# Patient Record
Sex: Male | Born: 1975 | Race: Black or African American | Hispanic: No | Marital: Single | Smoking: Former smoker
Health system: Southern US, Community
[De-identification: ages and names within clinical notes are randomized; demographics above are authoritative.]

---

## 1997-09-09 ENCOUNTER — Encounter: Admission: RE | Admit: 1997-09-09 | Discharge: 1997-09-09 | Payer: Self-pay | Admitting: *Deleted

## 2000-01-13 ENCOUNTER — Emergency Department (HOSPITAL_COMMUNITY): Admission: EM | Admit: 2000-01-13 | Discharge: 2000-01-13 | Payer: Self-pay | Admitting: Emergency Medicine

## 2000-01-20 ENCOUNTER — Emergency Department (HOSPITAL_COMMUNITY): Admission: EM | Admit: 2000-01-20 | Discharge: 2000-01-20 | Payer: Self-pay

## 2004-11-28 ENCOUNTER — Encounter: Admission: RE | Admit: 2004-11-28 | Discharge: 2004-11-28 | Payer: Self-pay | Admitting: Occupational Medicine

## 2011-03-06 ENCOUNTER — Ambulatory Visit (INDEPENDENT_AMBULATORY_CARE_PROVIDER_SITE_OTHER): Payer: PRIVATE HEALTH INSURANCE | Admitting: Family Medicine

## 2011-03-06 ENCOUNTER — Ambulatory Visit: Payer: PRIVATE HEALTH INSURANCE

## 2011-03-06 VITALS — BP 122/78 | HR 84 | Temp 98.4°F | Resp 18 | Ht 69.0 in | Wt 197.6 lb

## 2011-03-06 DIAGNOSIS — M25539 Pain in unspecified wrist: Secondary | ICD-10-CM

## 2011-03-06 DIAGNOSIS — R369 Urethral discharge, unspecified: Secondary | ICD-10-CM

## 2011-03-06 LAB — POCT URINALYSIS DIPSTICK
Bilirubin, UA: NEGATIVE
Glucose, UA: NEGATIVE
Ketones, UA: NEGATIVE
Leukocytes, UA: NEGATIVE
Nitrite, UA: NEGATIVE
Protein, UA: NEGATIVE
Urobilinogen, UA: 1
pH, UA: 7

## 2011-03-06 LAB — POCT UA - MICROSCOPIC ONLY
Bacteria, U Microscopic: NEGATIVE
Yeast, UA: NEGATIVE

## 2011-03-06 MED ORDER — METRONIDAZOLE 500 MG PO TABS: ORAL_TABLET | ORAL | Status: DC

## 2011-03-06 NOTE — Progress Notes (Signed)
  Subjective:    Patient ID: Cameron Stephenson, male    DOB: 12/21/1975, 36 y.o.   MRN: [id number]  HPI 36 yo male with two complaints 1) penile discharge - similar 1 year ago.  Tests negative.  Has continued to have off and on for occasion.  More common if hold urine for awhile.  Thick, milky consistency.  Ex did cheat onhim.  Does not know of specific disease exposure.  Unprotected sex with ex 5-6 months ago, but none since.    2) right hand pain- at wrist, dorsal surface.  Knot there.  Pain occasionally.  Sometimes clicks.  When it hurts can radiate up into forearm.  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    Review of Systems Negative except as per HPI     Objective:   Physical Exam  Constitutional: He appears well-developed and well-nourished.  Genitourinary: Testes normal and penis normal.  Musculoskeletal:       Right wrist: He exhibits normal range of motion, no tenderness, no bony tenderness, no swelling, no crepitus and no deformity.   Possible very small early ganglion cyst palpable on dorsal surface of right wrist.   [facility] Primary radiology reading by Dr. Georgiana Shore: Negative.  No fracture or other abnormality   Results for orders placed in visit on 03/06/11  POCT URINALYSIS DIPSTICK      Component Value Range   Color, UA yelloe     Clarity, UA clear     Glucose, UA neg     Bilirubin, UA neg     Ketones, UA neg     Spec Grav, UA 1.015     Blood, UA mod     pH, UA 7.0     Protein, UA neg     Urobilinogen, UA 1.0     Nitrite, UA neg     Leukocytes, UA Negative    POCT UA - MICROSCOPIC ONLY      Component Value Range   WBC, Ur, HPF, POC 0-2     RBC, urine, microscopic 2-4     Bacteria, U Microscopic neg     Mucus, UA neg     Epithelial cells, urine per micros neg     Crystals, Ur, HPF, POC neg     Casts, Ur, LPF, POC neg     Yeast, UA neg         Assessment & Plan:  Penile discharge - normal exam.  Intermittent symptoms.  NO exact exposure  known.  uriprobe pending.  Treat with flagyl 500, 4 po x 1 dose to cover for trich as can be hard to diagnose.   Wrist pain - benign exam.  Sporadic symptoms.  Ice, aleve prn.  RTC if worsens.

## 2011-03-08 ENCOUNTER — Telehealth: Payer: Self-pay

## 2011-03-08 DIAGNOSIS — R369 Urethral discharge, unspecified: Secondary | ICD-10-CM

## 2011-03-08 LAB — GC/CHLAMYDIA PROBE AMP, URINE

## 2011-03-08 NOTE — Telephone Encounter (Signed)
Dr. Georgiana Shore, [facility] called and said that they were unable to run the Uriprobe due to some lab error.

## 2011-03-09 NOTE — Telephone Encounter (Signed)
Left message on machine to call back  

## 2011-03-09 NOTE — Telephone Encounter (Signed)
Pt called back. Explained to pt another sample is needed and needs to RTC for lab only. Pt agreed.

## 2011-03-09 NOTE — Telephone Encounter (Signed)
Please contact patient and act him to return to give new sample. THanks.

## 2011-03-09 NOTE — Telephone Encounter (Signed)
Future order placed for uriprobe.

## 2011-03-09 NOTE — Telephone Encounter (Signed)
Addended by: Lamar Laundry on: 03/09/2011 05:47 PM   Modules accepted: Orders

## 2011-03-18 ENCOUNTER — Other Ambulatory Visit (INDEPENDENT_AMBULATORY_CARE_PROVIDER_SITE_OTHER): Payer: PRIVATE HEALTH INSURANCE

## 2011-03-18 DIAGNOSIS — Z7251 High risk heterosexual behavior: Secondary | ICD-10-CM

## 2011-03-18 DIAGNOSIS — R369 Urethral discharge, unspecified: Secondary | ICD-10-CM

## 2011-03-20 ENCOUNTER — Telehealth: Payer: Self-pay

## 2011-03-20 LAB — GC/CHLAMYDIA PROBE AMP, URINE
Chlamydia, Swab/Urine, PCR: NEGATIVE
GC Probe Amp, Urine: NEGATIVE

## 2011-03-20 NOTE — Telephone Encounter (Signed)
Pt returning clinical nurse phone call please contact

## 2011-03-21 NOTE — Telephone Encounter (Signed)
Please see lab note. Completed.

## 2013-01-19 IMAGING — CR DG WRIST COMPLETE 3+V*R*
2 series · 2 of 2 positions shown · non-contrast
Comparison: None.

CLINICAL DATA: Intermittent right wrist pain.

RIGHT WRIST - COMPLETE 3+ VIEW

[PA]
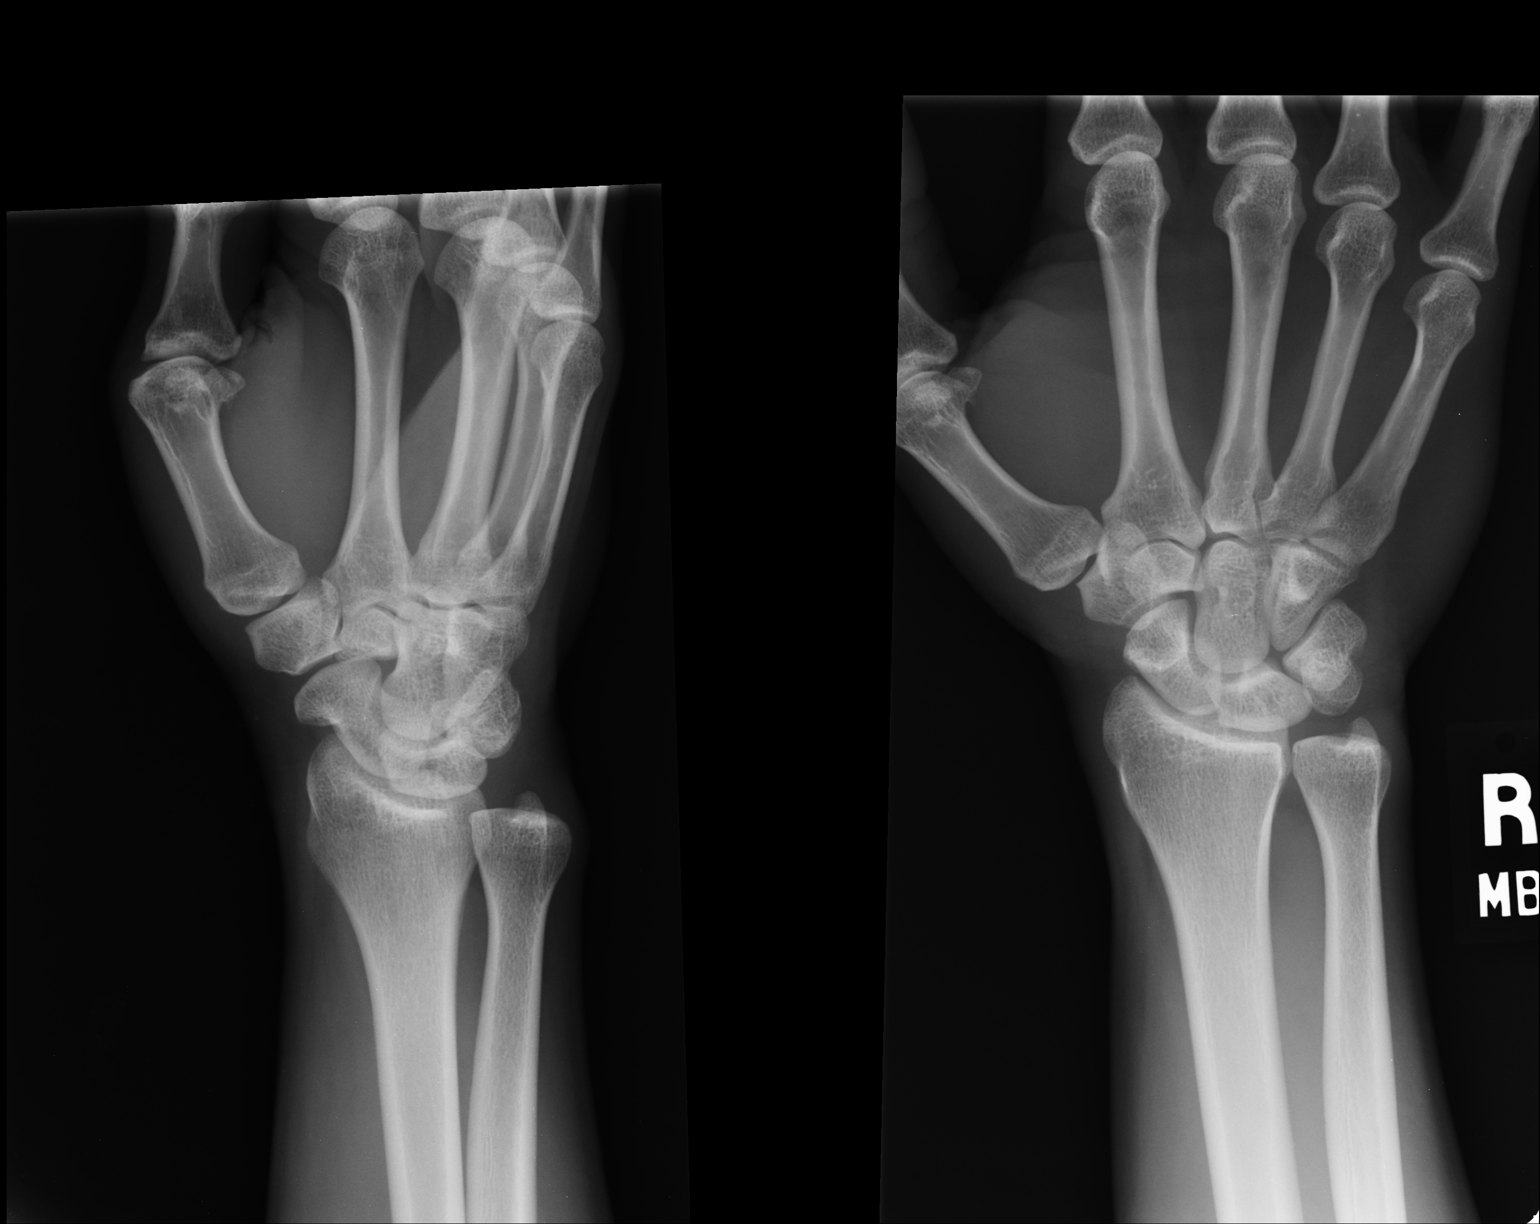

[lateral]
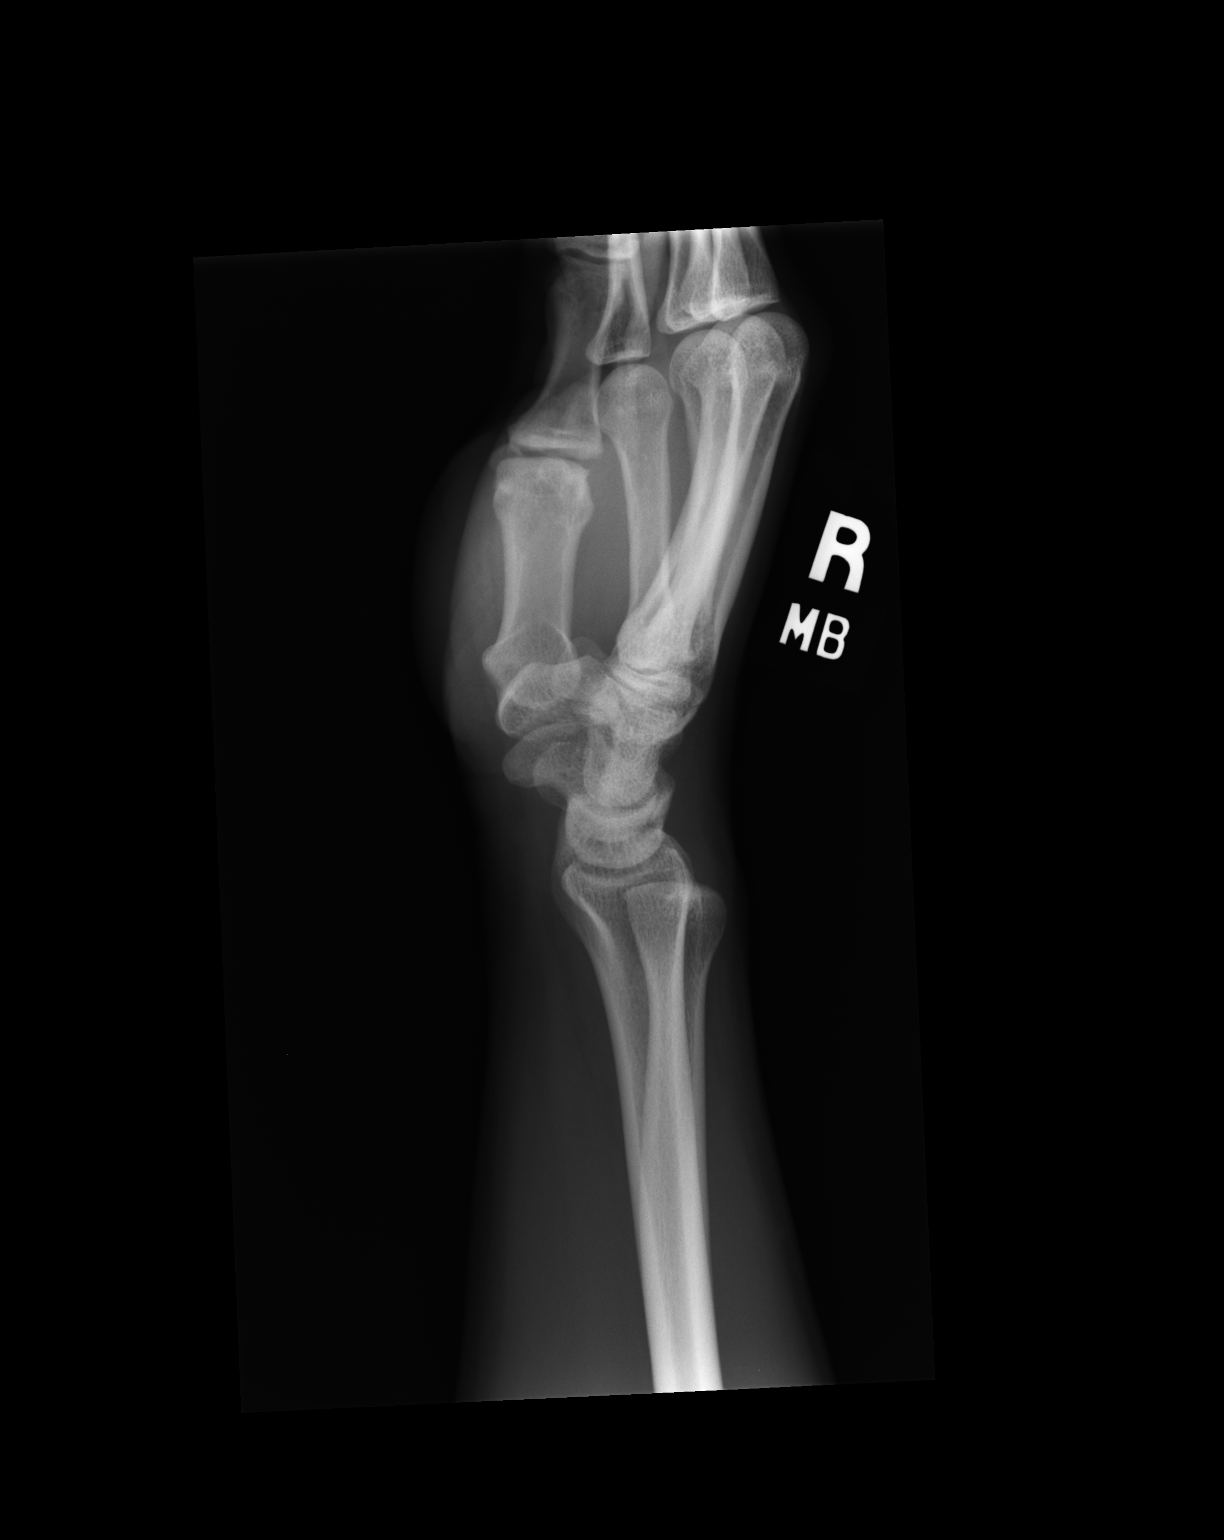

[2 of 2 positions shown; findings below may reference images not displayed]

FINDINGS: The right wrist is located.  No acute bone or soft tissue
abnormality is present.
IMPRESSION: Negative right wrist.

## 2013-02-28 ENCOUNTER — Ambulatory Visit (INDEPENDENT_AMBULATORY_CARE_PROVIDER_SITE_OTHER): Payer: PRIVATE HEALTH INSURANCE | Admitting: Internal Medicine

## 2013-02-28 VITALS — BP 148/80 | HR 73 | Temp 98.0°F | Resp 16 | Ht 70.0 in | Wt 201.0 lb

## 2013-02-28 DIAGNOSIS — M25531 Pain in right wrist: Secondary | ICD-10-CM

## 2013-02-28 DIAGNOSIS — H00019 Hordeolum externum unspecified eye, unspecified eyelid: Secondary | ICD-10-CM

## 2013-02-28 DIAGNOSIS — M25539 Pain in unspecified wrist: Secondary | ICD-10-CM

## 2013-02-28 NOTE — Progress Notes (Signed)
Subjective:    Patient ID: Cameron Stephenson, male    DOB: 25-Oct-1975, 38 y.o.   MRN: [id number] This chart was scribed for Ellamae Siaobert Kaheem Halleck, MD by Valera CastleSteven Perry, ED Scribe. This patient was seen in room 05 and the patient's care was started at 6:32 PM.  Chief Complaint  Patient presents with  . Follow-up    right wrist pain  . Stye    left eye    HPI Cameron Stephenson is a 38 y.o. male He reports a bump over his left eye, onset many months ago. He denies the area being painful, draining. He reports the area will intermittently be itchy. He denies fever, and any other associated symptoms.    He also reports constant right wrist pain. He states he was seen here before for his wrist pain, having imaging done, with negative results. He denies recent trauma. He reports when he was seen before for his wrist pain, the pain had been radiating up his right arm. He denies his current pain radiating up his arm. He denies any other bony pain. 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   PCP - No primary provider on file.  There are no active problems to display for this patient.  Prior to Admission medications   Medication Sig Start Date End Date Taking? Authorizing Provider  metroNIDAZOLE (FLAGYL) 500 MG tablet 4 tabs po for one dose 03/06/11   Lamar LaundryLaura C Mayans, MD   Review of Systems  Constitutional: Negative for fever.  Eyes: Positive for itching (itchy bump/stye over left eye, non painful).  Musculoskeletal: Positive for arthralgias (right wrist).      Objective:   Physical Exam  Nursing note and vitals reviewed. Constitutional: He is oriented to person, place, and time. He appears well-developed and well-nourished. No distress.  HENT:  Head: Normocephalic and atraumatic.  Eyes: EOM are normal.  He has a 0.5 cm cyst that is nontender in the left upper lid a when necessary aspect/there is no internal or external pointing/he has no redness there are  tenderness The eye itself is clear  Neck: Neck supple.  Cardiovascular: Normal rate.   Pulmonary/Chest: Effort normal. No respiratory distress.  Musculoskeletal: Normal range of motion. He exhibits tenderness (over right wrist).  The right wrist has tenderness over the snuffbox and into the dorsum of the wrist that is made worse by range of motion. The grip is intact. No sensory losses or vascular compromise. Negative Finkelstein/ Negative Tinel's.  Lymphadenopathy:    He has no cervical adenopathy.  Neurological: He is alert and oriented to person, place, and time.  Skin: Skin is warm and dry.  Psychiatric: He has a normal mood and affect. His behavior is normal.    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 Rahshawn Remo P. Merla Richesoolittle, M.D.

## 2014-07-16 ENCOUNTER — Other Ambulatory Visit: Payer: Self-pay | Admitting: Orthopedic Surgery

## 2014-07-29 ENCOUNTER — Encounter (HOSPITAL_BASED_OUTPATIENT_CLINIC_OR_DEPARTMENT_OTHER): Payer: Self-pay | Admitting: *Deleted

## 2014-07-31 NOTE — H&P (Signed)
Cameron Stephenson is an 39 y.o. male.   CC / Reason for Visit: Right wrist pain HPI: This patient returns for reevaluation.  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  He states that he continues to have problems with his ECU or dislocating which causes pain into his forearm.  Presenting history follows: This patient is a 39 year old male who presents for evaluation of his right wrist.  He reports that he has had pain in the right wrist chronically, without any specific preceding trauma.  He localizes the pain to the snuffbox and the dorsal radial aspect of the wrist.  He reports that he also has some popping in the wrist that occurs with circumduction, sometimes it is painful.  He reports that sometime in the past he had a dorsal enlargement on the wrist that has resolved.  This may have been a ganglion cyst  History reviewed. No pertinent past medical history.  History reviewed. No pertinent past surgical history.  History reviewed. No pertinent family history. Social History:  reports that he quit smoking about 13 years ago. He does not have any smokeless tobacco history on file. He reports that he drinks alcohol. His drug history is not on file.  Allergies: No Known Allergies  No prescriptions prior to admission    No results found for this or any previous visit (from the past 48 hour(s)). No results found.  Review of Systems  All other systems reviewed and are negative.   Height 5\' 11"  (1.803 m), weight 95.255 kg (210 lb). Physical Exam  Constitutional:  WD, WN, NAD HEENT:  NCAT, EOMI Neuro/Psych:  Alert & oriented to person, place, and time; appropriate mood & affect Lymphatic: No generalized UE edema or lymphadenopathy Extremities / MSK:  Both UE are normal with respect to appearance, ranges of motion, joint stability, muscle strength/tone,  sensation, & perfusion except as otherwise noted:  Right wrist grossly normal in appearance.  Distinct ECU dislocation can still be elicited today.  Labs / X-rays:  None today.  Assessment: Right wrist ECU instability  Plan:  Results of the evaluation were discussed with him again.  It was further discussed that he would be kept out of heavy work for 2-1/2 months and that he would have to wear a splint that kept him in a supinated position.  He indicated he would like to proceed, he will call Olegario MessierKathy my surgery coordinator to make further arrangements. The details of the operative procedure were discussed with the patient.  Questions were invited and answered.  In addition to the goal of the procedure, the risks of the procedure to include but not limited to bleeding; infection; damage to the nerves or blood vessels that could result in bleeding, numbness, weakness, chronic pain, and the need for additional procedures; stiffness; the need for revision surgery; and anesthetic risks were reviewed.  No specific outcome was guaranteed or implied.  Informed consent was obtained.    Work status: All interested parties should please consider this patient to be out of work entirely if no work is available that complies with the restrictions detailed below.  If these restrictions result in the patient being out of work entirely, the employer is expected to provide documentation of such to all interested third parties such as disability insurance companies:    As tolerated for now, without restrictions.  Cameron Stephenson A. 07/31/2014, 3:09 PM

## 2014-08-03 ENCOUNTER — Encounter (HOSPITAL_BASED_OUTPATIENT_CLINIC_OR_DEPARTMENT_OTHER)
Admission: RE | Disposition: A | Discharge status: Home / Self Care | Payer: Self-pay | Source: Ambulatory Visit | Attending: Orthopedic Surgery

## 2014-08-03 ENCOUNTER — Ambulatory Visit (HOSPITAL_BASED_OUTPATIENT_CLINIC_OR_DEPARTMENT_OTHER)
Admission: RE | Admit: 2014-08-03 | Discharge: 2014-08-03 | Disposition: A | Discharge status: Home / Self Care | Payer: Managed Care, Other (non HMO) | Source: Ambulatory Visit | Attending: Orthopedic Surgery | Admitting: Orthopedic Surgery

## 2014-08-03 ENCOUNTER — Ambulatory Visit (HOSPITAL_BASED_OUTPATIENT_CLINIC_OR_DEPARTMENT_OTHER): Payer: Managed Care, Other (non HMO) | Admitting: Certified Registered"

## 2014-08-03 ENCOUNTER — Encounter (HOSPITAL_BASED_OUTPATIENT_CLINIC_OR_DEPARTMENT_OTHER): Payer: Self-pay | Admitting: *Deleted

## 2014-08-03 DIAGNOSIS — M67833 Other specified disorders of tendon, right wrist: Secondary | ICD-10-CM | POA: Diagnosis present

## 2014-08-03 DIAGNOSIS — Z87891 Personal history of nicotine dependence: Secondary | ICD-10-CM | POA: Insufficient documentation

## 2014-08-03 HISTORY — PX: REPAIR EXTENSOR TENDON: SHX5382

## 2014-08-03 LAB — POCT HEMOGLOBIN-HEMACUE: Hemoglobin: 14.7 g/dL (ref 13.0–17.0)

## 2014-08-03 SURGERY — REPAIR, TENDON, EXTENSOR
Anesthesia: General | Site: Wrist | Laterality: Right

## 2014-08-03 MED ORDER — GLYCOPYRROLATE 0.2 MG/ML IJ SOLN: 0.2000 mg | Freq: Once | INTRAMUSCULAR | Status: DC | PRN

## 2014-08-03 MED ORDER — DEXAMETHASONE SODIUM PHOSPHATE 10 MG/ML IJ SOLN
INTRAMUSCULAR | Status: DC | PRN
  Administered 2014-08-03: 10 mg via INTRAVENOUS

## 2014-08-03 MED ORDER — FENTANYL CITRATE (PF) 100 MCG/2ML IJ SOLN
INTRAMUSCULAR | Status: AC
  Filled 2014-08-03: qty 6

## 2014-08-03 MED ORDER — HYDROMORPHONE HCL 1 MG/ML IJ SOLN
0.2500 mg | INTRAMUSCULAR | Status: DC | PRN
  Administered 2014-08-03 (×2): 0.5 mg via INTRAVENOUS

## 2014-08-03 MED ORDER — LIDOCAINE HCL (CARDIAC) 20 MG/ML IV SOLN
INTRAVENOUS | Status: DC | PRN
  Administered 2014-08-03: 20 mg via INTRAVENOUS

## 2014-08-03 MED ORDER — PROPOFOL 10 MG/ML IV BOLUS
INTRAVENOUS | Status: DC | PRN
  Administered 2014-08-03: 300 mg via INTRAVENOUS

## 2014-08-03 MED ORDER — SCOPOLAMINE 1 MG/3DAYS TD PT72: 1.0000 | MEDICATED_PATCH | Freq: Once | TRANSDERMAL | Status: DC | PRN

## 2014-08-03 MED ORDER — ONDANSETRON HCL 4 MG/2ML IJ SOLN
INTRAMUSCULAR | Status: DC | PRN
  Administered 2014-08-03: 4 mg via INTRAVENOUS

## 2014-08-03 MED ORDER — LACTATED RINGERS IV SOLN: INTRAVENOUS | Status: DC

## 2014-08-03 MED ORDER — FENTANYL CITRATE (PF) 100 MCG/2ML IJ SOLN
50.0000 ug | INTRAMUSCULAR | Status: AC | PRN
  Administered 2014-08-03 (×2): 25 ug via INTRAVENOUS
  Administered 2014-08-03: 50 ug via INTRAVENOUS

## 2014-08-03 MED ORDER — HYDROMORPHONE HCL 1 MG/ML IJ SOLN
INTRAMUSCULAR | Status: AC
  Filled 2014-08-03: qty 1

## 2014-08-03 MED ORDER — PROMETHAZINE HCL 25 MG/ML IJ SOLN: 6.2500 mg | INTRAMUSCULAR | Status: DC | PRN

## 2014-08-03 MED ORDER — CEFAZOLIN SODIUM-DEXTROSE 2-3 GM-% IV SOLR
INTRAVENOUS | Status: AC
  Filled 2014-08-03: qty 50

## 2014-08-03 MED ORDER — MIDAZOLAM HCL 2 MG/2ML IJ SOLN
INTRAMUSCULAR | Status: AC
  Filled 2014-08-03: qty 2

## 2014-08-03 MED ORDER — MIDAZOLAM HCL 2 MG/2ML IJ SOLN
1.0000 mg | INTRAMUSCULAR | Status: DC | PRN
  Administered 2014-08-03: 2 mg via INTRAVENOUS

## 2014-08-03 MED ORDER — CEFAZOLIN SODIUM-DEXTROSE 2-3 GM-% IV SOLR
2.0000 g | INTRAVENOUS | Status: AC
  Administered 2014-08-03: 2 g via INTRAVENOUS

## 2014-08-03 MED ORDER — OXYCODONE-ACETAMINOPHEN 5-325 MG PO TABS: 1.0000 | ORAL_TABLET | ORAL | Status: AC | PRN

## 2014-08-03 MED ORDER — MEPERIDINE HCL 25 MG/ML IJ SOLN: 6.2500 mg | INTRAMUSCULAR | Status: DC | PRN

## 2014-08-03 MED ORDER — LACTATED RINGERS IV SOLN
INTRAVENOUS | Status: DC
Start: 2014-08-03 — End: 2014-08-03
  Administered 2014-08-03 (×2): via INTRAVENOUS

## 2014-08-03 MED ORDER — BUPIVACAINE-EPINEPHRINE 0.5% -1:200000 IJ SOLN
INTRAMUSCULAR | Status: DC | PRN
  Administered 2014-08-03: 10 mL

## 2014-08-03 MED ORDER — MIDAZOLAM HCL 2 MG/2ML IJ SOLN: 0.5000 mg | Freq: Once | INTRAMUSCULAR | Status: DC | PRN

## 2014-08-03 SURGICAL SUPPLY — 66 items
BLADE MINI RND TIP GREEN BEAV (BLADE) IMPLANT
BLADE SURG 15 STRL LF DISP TIS (BLADE) ×1 IMPLANT
BLADE SURG 15 STRL SS (BLADE) ×3
BNDG CMPR 9X4 STRL LF SNTH (GAUZE/BANDAGES/DRESSINGS) ×1
BNDG COHESIVE 4X5 TAN STRL (GAUZE/BANDAGES/DRESSINGS) ×3 IMPLANT
BNDG ESMARK 4X9 LF (GAUZE/BANDAGES/DRESSINGS) ×2 IMPLANT
BNDG GAUZE ELAST 4 BULKY (GAUZE/BANDAGES/DRESSINGS) ×6 IMPLANT
CHLORAPREP W/TINT 26ML (MISCELLANEOUS) ×3 IMPLANT
CORDS BIPOLAR (ELECTRODE) ×3 IMPLANT
COVER BACK TABLE 60X90IN (DRAPES) ×3 IMPLANT
COVER MAYO STAND STRL (DRAPES) ×3 IMPLANT
CUFF TOURNIQUET SINGLE 18IN (TOURNIQUET CUFF) ×2 IMPLANT
DECANTER SPIKE VIAL GLASS SM (MISCELLANEOUS) IMPLANT
DRAPE EXTREMITY T 121X128X90 (DRAPE) ×3 IMPLANT
DRAPE SURG 17X23 STRL (DRAPES) ×3 IMPLANT
DRSG EMULSION OIL 3X3 NADH (GAUZE/BANDAGES/DRESSINGS) ×3 IMPLANT
ELECT REM PT RETURN 9FT ADLT (ELECTROSURGICAL)
ELECTRODE REM PT RTRN 9FT ADLT (ELECTROSURGICAL) IMPLANT
GLOVE BIO SURGEON STRL SZ7.5 (GLOVE) ×3 IMPLANT
GLOVE BIOGEL PI IND STRL 7.0 (GLOVE) ×1 IMPLANT
GLOVE BIOGEL PI IND STRL 8 (GLOVE) ×1 IMPLANT
GLOVE BIOGEL PI INDICATOR 7.0 (GLOVE) ×4
GLOVE BIOGEL PI INDICATOR 8 (GLOVE) ×2
GLOVE ECLIPSE 6.5 STRL STRAW (GLOVE) ×5 IMPLANT
GOWN STRL REUS W/TWL XL LVL3 (GOWN DISPOSABLE) ×3 IMPLANT
K-WIRE .035X4 (WIRE) ×2 IMPLANT
LOOP VESSEL MAXI BLUE (MISCELLANEOUS) IMPLANT
LOOP VESSEL MINI RED (MISCELLANEOUS) IMPLANT
NDL HYPO 25X1 1.5 SAFETY (NEEDLE) IMPLANT
NEEDLE HYPO 25X1 1.5 SAFETY (NEEDLE) IMPLANT
NS IRRIG 1000ML POUR BTL (IV SOLUTION) ×3 IMPLANT
PACK BASIN DAY SURGERY FS (CUSTOM PROCEDURE TRAY) ×3 IMPLANT
PADDING CAST ABS 3INX4YD NS (CAST SUPPLIES)
PADDING CAST ABS 4INX4YD NS (CAST SUPPLIES) ×2
PADDING CAST ABS COTTON 3X4 (CAST SUPPLIES) IMPLANT
PADDING CAST ABS COTTON 4X4 ST (CAST SUPPLIES) ×1 IMPLANT
PENCIL BUTTON HOLSTER BLD 10FT (ELECTRODE) IMPLANT
SLEEVE SCD COMPRESS KNEE MED (MISCELLANEOUS) IMPLANT
SLING ARM LRG ADULT FOAM STRAP (SOFTGOODS) ×2 IMPLANT
SPLINT FIBERGLASS 3X35 (CAST SUPPLIES) ×2 IMPLANT
SPLINT PLASTER CAST XFAST 3X15 (CAST SUPPLIES) IMPLANT
SPLINT PLASTER XTRA FASTSET 3X (CAST SUPPLIES)
SPONGE GAUZE 4X4 12PLY STER LF (GAUZE/BANDAGES/DRESSINGS) ×3 IMPLANT
STOCKINETTE 6  STRL (DRAPES) ×2
STOCKINETTE 6 STRL (DRAPES) ×1 IMPLANT
SUT ETHIBOND 3-0 V-5 (SUTURE) ×1 IMPLANT
SUT FIBERWIRE 2-0 18 17.9 3/8 (SUTURE) ×3
SUT MERSILENE 4 0 P 3 (SUTURE) IMPLANT
SUT NYLON 9 0 VRM6 (SUTURE) IMPLANT
SUT PROLENE 6 0 P 1 18 (SUTURE) ×3 IMPLANT
SUT SILK 4 0 PS 2 (SUTURE) IMPLANT
SUT STEEL 4 (SUTURE) ×1 IMPLANT
SUT SUPRAMID 3-0 (SUTURE) IMPLANT
SUT SUPRAMID 4-0 (SUTURE) IMPLANT
SUT VIC AB 2-0 SH 27 (SUTURE) ×3
SUT VIC AB 2-0 SH 27XBRD (SUTURE) IMPLANT
SUT VICRYL RAPIDE 4-0 (SUTURE) IMPLANT
SUT VICRYL RAPIDE 4/0 PS 2 (SUTURE) ×3 IMPLANT
SUTURE FIBERWR 2-0 18 17.9 3/8 (SUTURE) IMPLANT
SYR BULB 3OZ (MISCELLANEOUS) ×3 IMPLANT
SYRINGE 10CC LL (SYRINGE) ×2 IMPLANT
TOWEL OR 17X24 6PK STRL BLUE (TOWEL DISPOSABLE) ×3 IMPLANT
TUBE CONNECTING 20'X1/4 (TUBING)
TUBE CONNECTING 20X1/4 (TUBING) IMPLANT
TUBE FEEDING 5FR 15 INCH (TUBING) ×2 IMPLANT
UNDERPAD 30X30 (UNDERPADS AND DIAPERS) ×3 IMPLANT

## 2014-08-03 NOTE — Transfer of Care (Signed)
Immediate Anesthesia Transfer of Care Note  Patient: Cameron Stephenson  Procedure(s) Performed: Procedure(s): RIGHT EXTENSOR CARPI ULNARIS STABILIZATION (Right)  Patient Location: PACU  Anesthesia Type:General  Level of Consciousness: awake, alert , oriented and patient cooperative  Airway & Oxygen Therapy: Patient Spontanous Breathing and Patient connected to face mask oxygen  Post-op Assessment: Report given to RN and Post -op Vital signs reviewed and stable  Post vital signs: Reviewed and stable  Last Vitals:  Filed Vitals:   08/03/14 0858  BP: 127/73  Pulse: 67  Temp: 36.6 C  Resp: 20    Complications: No apparent anesthesia complications

## 2014-08-03 NOTE — Discharge Instructions (Signed)

## 2014-08-03 NOTE — Anesthesia Postprocedure Evaluation (Signed)
  Anesthesia Post-op Note  Patient: Cameron Stephenson  Procedure(s) Performed: Procedure(s): RIGHT EXTENSOR CARPI ULNARIS STABILIZATION (Right)  Patient Location: PACU  Anesthesia Type:General  Level of Consciousness: awake, alert , oriented and patient cooperative  Airway and Oxygen Therapy: Patient Spontanous Breathing  Post-op Pain: mild  Post-op Assessment: Post-op Vital signs reviewed, Patient's Cardiovascular Status Stable, Respiratory Function Stable, Patent Airway, No signs of Nausea or vomiting and Pain level controlled              Post-op Vital Signs: Reviewed and stable  Last Vitals:  Filed Vitals:   08/03/14 1245  BP: 112/82  Pulse: 66  Temp: 36.4 C  Resp: 20    Complications: No apparent anesthesia complications

## 2014-08-03 NOTE — Anesthesia Preprocedure Evaluation (Addendum)
Anesthesia Evaluation  Patient identified by MRN, date of birth, ID band Patient awake    Reviewed: Allergy & Precautions, NPO status , Patient's Chart, lab work & pertinent test results  History of Anesthesia Complications Negative for: history of anesthetic complications  Airway Mallampati: I  TM Distance: >3 FB Neck ROM: Full    Dental  (+) Chipped, Dental Advisory Given   Pulmonary former smoker (quit '03),  breath sounds clear to auscultation        Cardiovascular - anginanegative cardio ROS  Rhythm:Regular Rate:Normal     Neuro/Psych negative neurological ROS     GI/Hepatic negative GI ROS, Neg liver ROS,   Endo/Other  negative endocrine ROS  Renal/GU negative Renal ROS     Musculoskeletal   Abdominal   Peds  Hematology negative hematology ROS (+)   Anesthesia Other Findings   Reproductive/Obstetrics                            Anesthesia Physical Anesthesia Plan  ASA: II  Anesthesia Plan: General   Post-op Pain Management:    Induction: Intravenous  Airway Management Planned: LMA  Additional Equipment:   Intra-op Plan:   Post-operative Plan:   Informed Consent: I have reviewed the patients History and Physical, chart, labs and discussed the procedure including the risks, benefits and alternatives for the proposed anesthesia with the patient or authorized representative who has indicated his/her understanding and acceptance.   Dental advisory given  Plan Discussed with: CRNA and Surgeon  Anesthesia Plan Comments: (Plan routine monitors, GA- LMA OK)        Anesthesia Quick Evaluation

## 2014-08-03 NOTE — Anesthesia Procedure Notes (Addendum)
Procedure Name: LMA Insertion Date/Time: 08/03/2014 10:19 AM Performed by: Jeptha Hinnenkamp D Pre-anesthesia Checklist: Patient identified, Emergency Drugs available, Suction available and Patient being monitored Patient Re-evaluated:Patient Re-evaluated prior to inductionOxygen Delivery Method: Circle System Utilized Preoxygenation: Pre-oxygenation with 100% oxygen Intubation Type: IV induction Ventilation: Mask ventilation without difficulty LMA: LMA inserted LMA Size: 4.0 Number of attempts: 1 Airway Equipment and Method: Bite block Placement Confirmation: positive ETCO2 Tube secured with: Tape Dental Injury: Teeth and Oropharynx as per pre-operative assessment

## 2014-08-03 NOTE — Interval H&P Note (Signed)
History and Physical Interval Note:  08/03/2014 10:03 AM  Cameron ManAugustine Quinby  has presented today for surgery, with the diagnosis of RIGHT EXTENSOR CARPI ULNARIS INSTABLILITY  The various methods of treatment have been discussed with the patient and family. After consideration of risks, benefits and other options for treatment, the patient has consented to  Procedure(s): RIGHT EXTENSOR CARPI ULNARIS STABILIZATION (Right) as a surgical intervention .  The patient's history has been reviewed, patient examined, no change in status, stable for surgery.  I have reviewed the patient's chart and labs.  Questions were answered to the patient's satisfaction.     Ashlea Dusing A.

## 2014-08-03 NOTE — Op Note (Signed)
08/03/2014  11:27 AM  PATIENT:  Cameron Stephenson  39 y.o. male  PRE-OPERATIVE DIAGNOSIS:  Right ECU instability  POST-OPERATIVE DIAGNOSIS:  Same  PROCEDURE:  Stabilization of right ECU tendon  SURGEON: Cliffton Astersavid A. Janee Mornhompson, MD  PHYSICIAN ASSISTANT: Danielle RankinKirsten Schrader, OPA-C, who strained assistants was critical for retraction, suture management, etc.  ANESTHESIA:  general  SPECIMENS:  None  DRAINS:   None  EBL:  less than 50 mL  PREOPERATIVE INDICATIONS:  Cameron Manugustine Loeffelholz is a  39 y.o. 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  OPERATIVE IMPLANTS: None  OPERATIVE PROCEDURE:  After receiving prophylactic antibiotics, the patient was escorted to the operative theatre and placed in a supine position. General anesthesia was administered. A surgical "time-out" was performed during which the planned procedure, proposed operative site, and the correct patient identity were compared to the operative consent and agreement confirmed by the circulating nurse according to current facility policy.  Following application of a tourniquet to the operative extremity, the exposed skin was prepped with Chloraprep and draped in the usual sterile fashion.  The limb was exsanguinated with an Esmarch bandage and the tourniquet inflated to approximately 100mmHg higher than systolic BP.  A linear incision was marked, anesthetized with half percent Marcaine with epinephrine, and made sharply over the sixth compartment distally. Full thickness laceration elevated with care to protect and preserve the dorsal cutaneous nerve of the ulnar nerve. The retinaculum was divided far  ulnarly and reflected dorsally. The ECU sub-sheath was identified, and the manner in which the tendon slipped over the prominence outside the bony groove could be appreciated. The sheath was split near its ulnar attachment, and it was repaired with horizontal mattress sutures through drill holes with 2-0 FiberWire suture. A pediatric feeding tube was placed alongside the tendon to help ensure that the compartment was not too tight or stenotic. Proximal and distal to this   area of interest, 20 Viagra was used to reapproximate and partially imbricate more proximal and distal restraining structures for the tendon. Again it was done with care to ensure that it was not too stenotic. The forearm was manipulated and pronation and supination with different aspects of wrist flexion and extension and the tendon was found to be better secured in its groove without subluxation or dislocation. Retinaculum was placed over top, repaired with 40 Vicryl Rapide running suture, and the tourniquet was released. Additional hemostasis was unnecessary and the wound was irrigated and skin closed with 4-0 Vicryl Rapide interrupted and running horizontal mattress sutures. A shoulder tong splint dressing was applied, and he was awakened and taken to the recovery room in stable condition, breathing spontaneously.  DISPOSITION: He'll be discharged home today with typical instructions. When he returns, he will have a follow on hand therapy appointment for a [place] splint to be constructed, placing the forearm in 45 pronation.

## 2014-08-04 ENCOUNTER — Encounter (HOSPITAL_BASED_OUTPATIENT_CLINIC_OR_DEPARTMENT_OTHER): Payer: Self-pay | Admitting: Orthopedic Surgery

## 2018-11-26 ENCOUNTER — Other Ambulatory Visit: Payer: Self-pay

## 2018-11-26 DIAGNOSIS — Z20822 Contact with and (suspected) exposure to covid-19: Secondary | ICD-10-CM

## 2018-11-27 LAB — NOVEL CORONAVIRUS, NAA: SARS-CoV-2, NAA: NOT DETECTED
